# Patient Record
Sex: Female | Born: 1955 | Race: White | Hispanic: No | Marital: Single | Smoking: Current every day smoker
Health system: Southern US, Community
[De-identification: ages and names within clinical notes are randomized; demographics above are authoritative.]

## PROBLEM LIST (undated history)

## (undated) DIAGNOSIS — I10 Essential (primary) hypertension: Secondary | ICD-10-CM

---

## 1999-07-29 ENCOUNTER — Other Ambulatory Visit: Admission: RE | Admit: 1999-07-29 | Discharge: 1999-07-29 | Payer: Self-pay | Admitting: Obstetrics and Gynecology

## 2000-09-02 ENCOUNTER — Other Ambulatory Visit: Admission: RE | Admit: 2000-09-02 | Discharge: 2000-09-02 | Payer: Self-pay | Admitting: Obstetrics and Gynecology

## 2002-05-23 ENCOUNTER — Other Ambulatory Visit: Admission: RE | Admit: 2002-05-23 | Discharge: 2002-05-23 | Payer: Self-pay | Admitting: Obstetrics and Gynecology

## 2003-07-19 ENCOUNTER — Other Ambulatory Visit: Admission: RE | Admit: 2003-07-19 | Discharge: 2003-07-19 | Payer: Self-pay | Admitting: Internal Medicine

## 2005-01-01 ENCOUNTER — Other Ambulatory Visit: Admission: RE | Admit: 2005-01-01 | Discharge: 2005-01-01 | Payer: Self-pay | Admitting: Internal Medicine

## 2005-11-08 ENCOUNTER — Encounter: Admission: RE | Admit: 2005-11-08 | Discharge: 2005-11-08 | Payer: Self-pay | Admitting: Family Medicine

## 2006-02-08 ENCOUNTER — Other Ambulatory Visit: Admission: RE | Admit: 2006-02-08 | Discharge: 2006-02-08 | Payer: Self-pay | Admitting: Obstetrics and Gynecology

## 2006-03-02 ENCOUNTER — Ambulatory Visit (HOSPITAL_COMMUNITY): Admission: RE | Admit: 2006-03-02 | Discharge: 2006-03-02 | Payer: Self-pay | Admitting: Obstetrics and Gynecology

## 2007-04-27 IMAGING — US US TRANSVAGINAL NON-OB
1 series · 14 of 25 positions shown · non-contrast
Comparison: None.

CLINICAL DATA: Fibroid uterus.
 TRANSABDOMINAL AND TRANSVAGINAL PELVIC ULTRASOUND:
TECHNIQUE: Both transabdominal and transvaginal ultrasound examinations of the pelvis were performed including evaluation of the uterus, ovaries, adnexal regions, and pelvic cul-de-sac.

[Series 1: us transvaginal non-ob · 0.33mm/px · 14 of 75 slices shown]
[im 1/75]
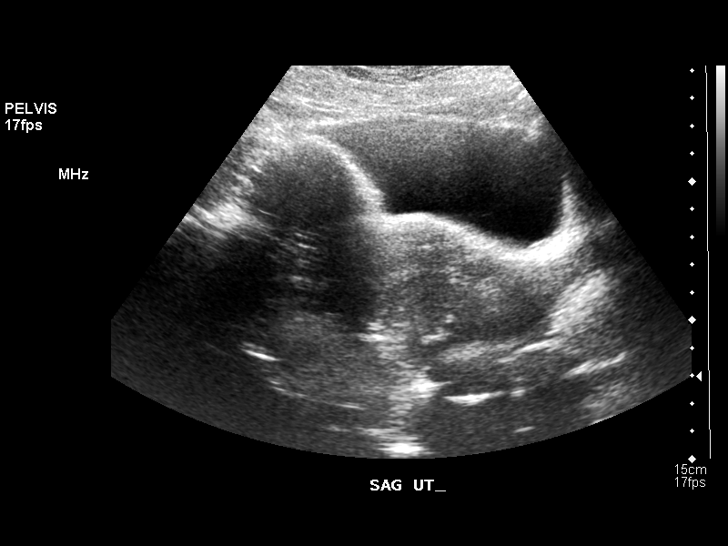
[im 7/75]
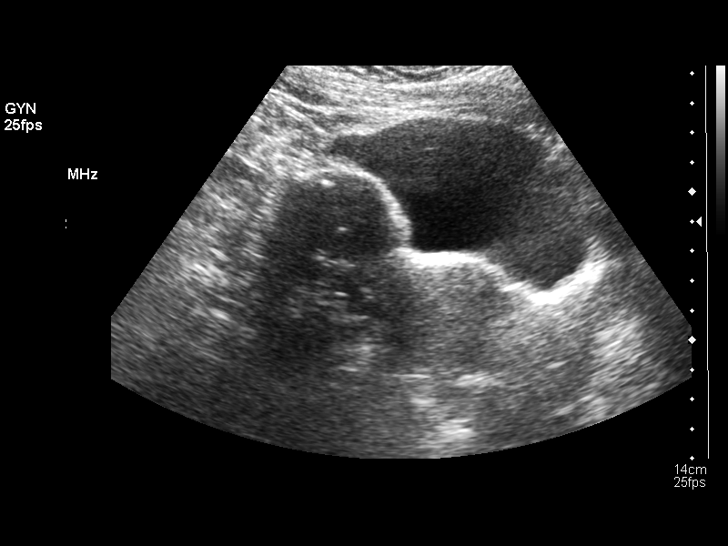
[im 13/75]
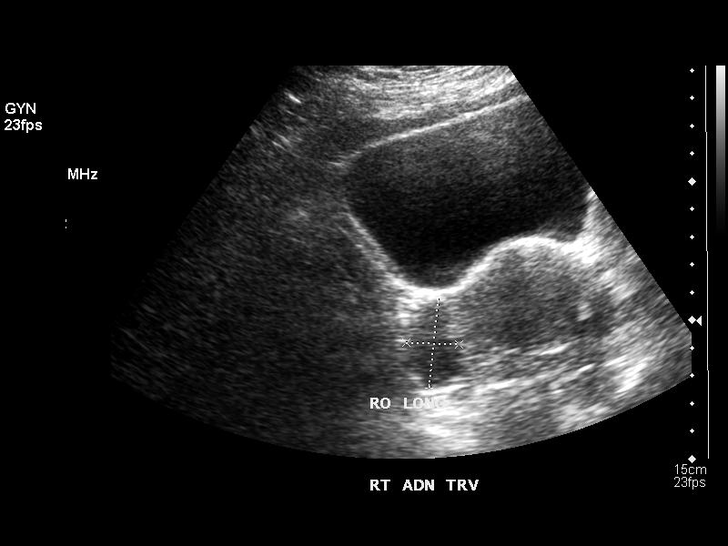
[im 19/75]
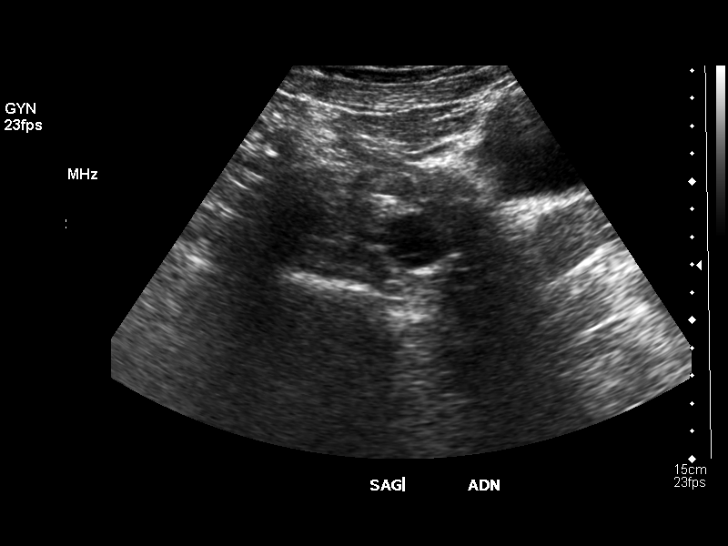
[im 25/75]
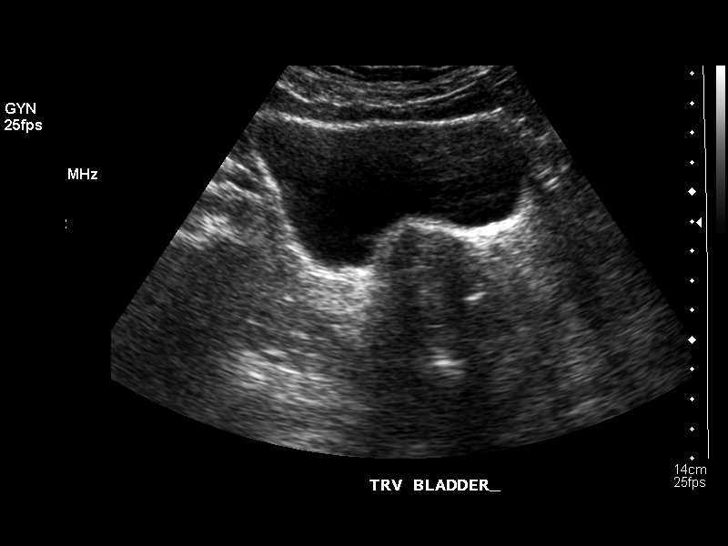
[im 28/75]
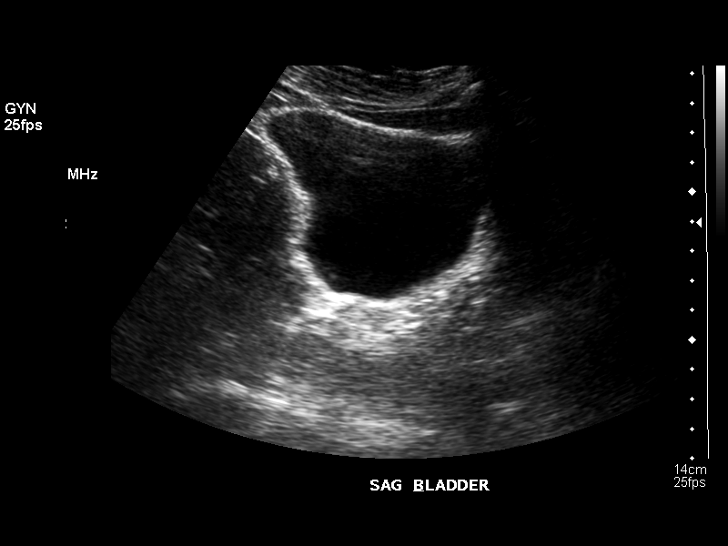
[im 34/75]
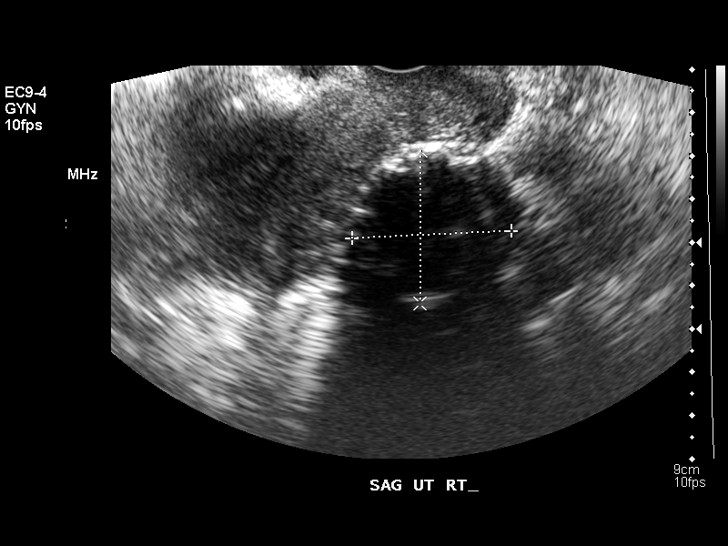
[im 41/75]
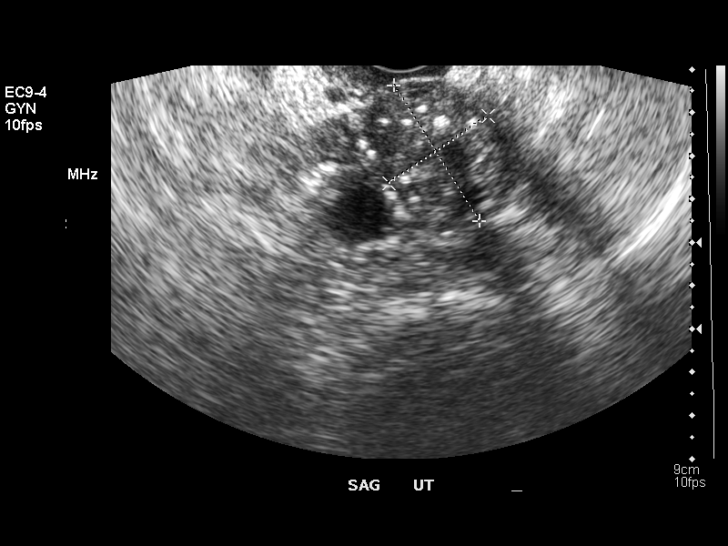
[im 47/75]
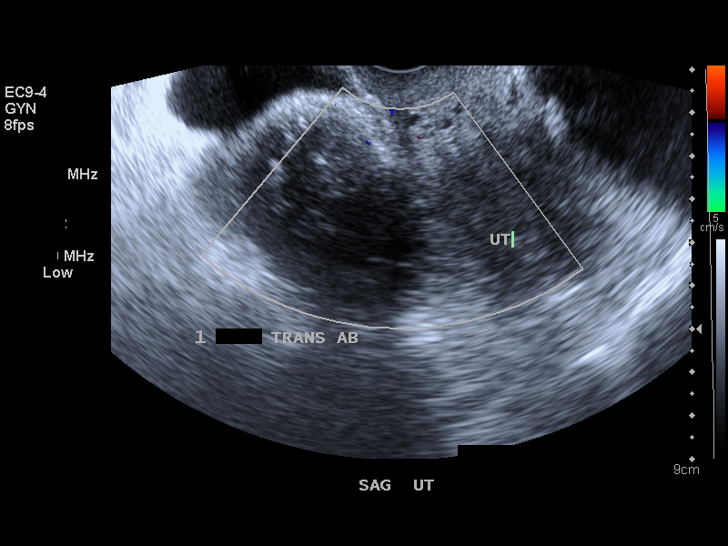
[im 50/75]
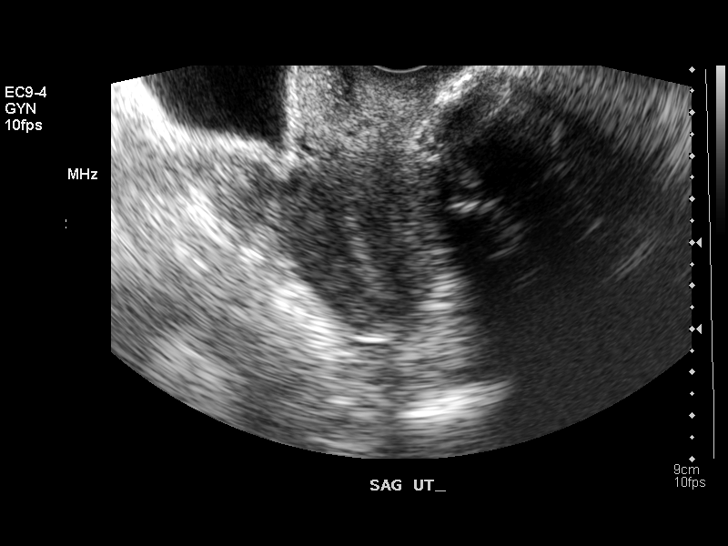
[im 56/75]
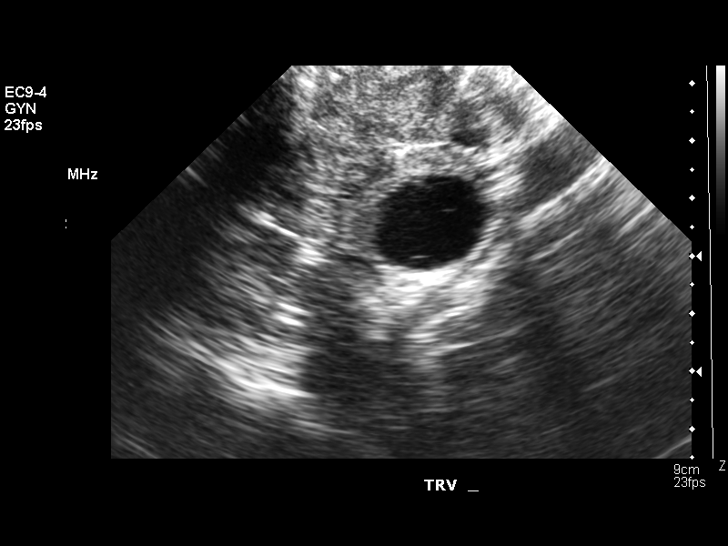
[im 62/75]
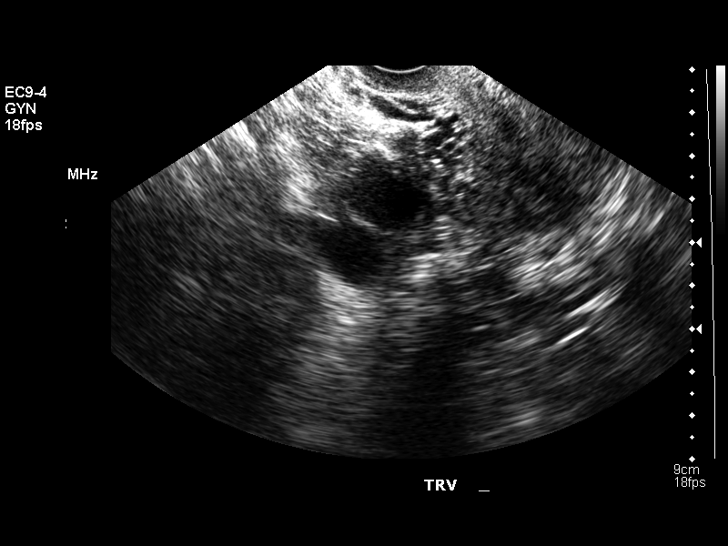
[im 68/75]
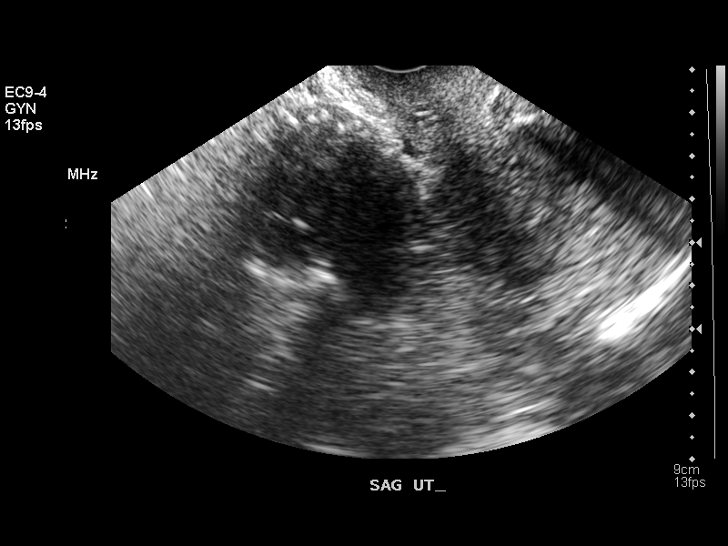
[im 75/75]
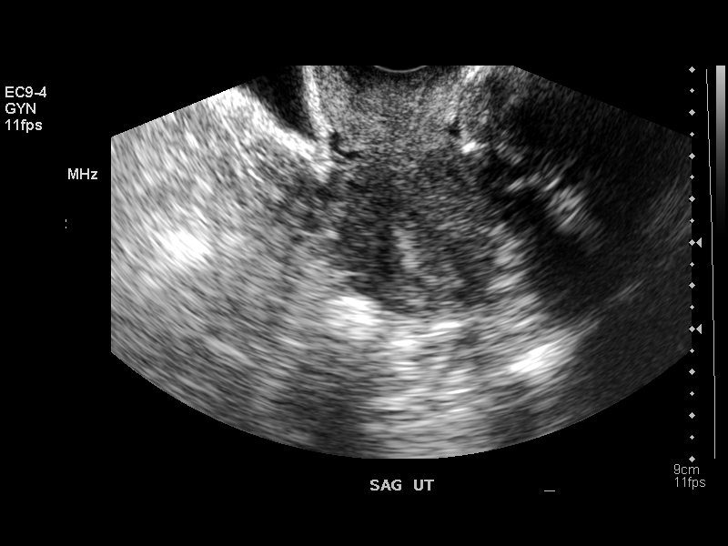

[14 of 25 positions shown; findings below may reference images not displayed]

FINDINGS: The uterus measures 7.3 x 3.8 x 4.4 cm.  The endometrium measures 5 mm and is uniformly echogenic.  At least three partly intramural and partly subserosal uterine fibroids are identified.  The largest of these with an approximate 50% subserosal/50% intramural component measures 4.7 x 4.2 x 4.1 cm at the uterine fundus.  The second largest in the posterior right uterine body measures 3.7 x 3.6 x 3.5 cm and another measures 3.7 x 3.3 x 2.8 cm in the left anterior uterine body.  No appreciable submucosal extent is seen.  The ovaries are unremarkable with bilateral follicles incidentally noted.  No free fluid.
IMPRESSION: Fibroid uterus with fibroids demonstrating 50% subserosal/50% intramural components but no appreciable mass effect upon the endometrial stripe.

## 2010-07-19 ENCOUNTER — Encounter: Payer: Self-pay | Admitting: *Deleted

## 2010-09-09 ENCOUNTER — Other Ambulatory Visit: Payer: Self-pay

## 2010-09-09 ENCOUNTER — Other Ambulatory Visit (HOSPITAL_COMMUNITY)
Admission: RE | Admit: 2010-09-09 | Discharge: 2010-09-09 | Disposition: A | Discharge status: Home / Self Care | Payer: Self-pay | Source: Ambulatory Visit | Attending: Internal Medicine | Admitting: Internal Medicine

## 2010-09-09 ENCOUNTER — Other Ambulatory Visit: Payer: Self-pay | Admitting: Family Medicine

## 2010-09-09 DIAGNOSIS — Z01419 Encounter for gynecological examination (general) (routine) without abnormal findings: Secondary | ICD-10-CM | POA: Insufficient documentation

## 2010-09-09 DIAGNOSIS — Z1231 Encounter for screening mammogram for malignant neoplasm of breast: Secondary | ICD-10-CM

## 2010-10-01 ENCOUNTER — Other Ambulatory Visit: Payer: Self-pay | Admitting: Obstetrics and Gynecology

## 2010-10-21 ENCOUNTER — Ambulatory Visit: Payer: Self-pay

## 2010-11-02 ENCOUNTER — Other Ambulatory Visit (HOSPITAL_COMMUNITY): Payer: Self-pay | Admitting: Family Medicine

## 2010-11-02 DIAGNOSIS — Z1231 Encounter for screening mammogram for malignant neoplasm of breast: Secondary | ICD-10-CM

## 2010-11-06 ENCOUNTER — Ambulatory Visit (HOSPITAL_COMMUNITY): Payer: Self-pay

## 2010-12-04 ENCOUNTER — Ambulatory Visit (HOSPITAL_COMMUNITY)
Admission: RE | Admit: 2010-12-04 | Discharge: 2010-12-04 | Disposition: A | Discharge status: Home / Self Care | Payer: Self-pay | Source: Ambulatory Visit | Attending: Family Medicine | Admitting: Family Medicine

## 2010-12-04 DIAGNOSIS — Z1231 Encounter for screening mammogram for malignant neoplasm of breast: Secondary | ICD-10-CM

## 2012-03-08 ENCOUNTER — Other Ambulatory Visit: Payer: Self-pay | Admitting: Family Medicine

## 2012-03-08 DIAGNOSIS — Z1231 Encounter for screening mammogram for malignant neoplasm of breast: Secondary | ICD-10-CM

## 2017-11-24 ENCOUNTER — Encounter (HOSPITAL_COMMUNITY): Payer: Self-pay | Admitting: Emergency Medicine

## 2017-11-24 ENCOUNTER — Emergency Department (HOSPITAL_COMMUNITY)
Admission: EM | Admit: 2017-11-24 | Discharge: 2017-11-24 | Disposition: A | Discharge status: Home / Self Care | Payer: Self-pay | Attending: Emergency Medicine | Admitting: Emergency Medicine

## 2017-11-24 ENCOUNTER — Other Ambulatory Visit: Payer: Self-pay

## 2017-11-24 DIAGNOSIS — T782XXA Anaphylactic shock, unspecified, initial encounter: Secondary | ICD-10-CM | POA: Insufficient documentation

## 2017-11-24 DIAGNOSIS — I10 Essential (primary) hypertension: Secondary | ICD-10-CM | POA: Insufficient documentation

## 2017-11-24 DIAGNOSIS — N390 Urinary tract infection, site not specified: Secondary | ICD-10-CM | POA: Insufficient documentation

## 2017-11-24 DIAGNOSIS — T7840XA Allergy, unspecified, initial encounter: Secondary | ICD-10-CM

## 2017-11-24 HISTORY — DX: Essential (primary) hypertension: I10

## 2017-11-24 LAB — URINALYSIS, ROUTINE W REFLEX MICROSCOPIC
BILIRUBIN URINE: NEGATIVE
Glucose, UA: 50 mg/dL — AB
Ketones, ur: NEGATIVE mg/dL
NITRITE: NEGATIVE
PH: 6 (ref 5.0–8.0)
Protein, ur: NEGATIVE mg/dL
SPECIFIC GRAVITY, URINE: 1.014 (ref 1.005–1.030)

## 2017-11-24 LAB — WET PREP, GENITAL
Clue Cells Wet Prep HPF POC: NONE SEEN
Sperm: NONE SEEN
Trich, Wet Prep: NONE SEEN
Yeast Wet Prep HPF POC: NONE SEEN

## 2017-11-24 MED ORDER — FAMOTIDINE IN NACL 20-0.9 MG/50ML-% IV SOLN
20.0000 mg | Freq: Once | INTRAVENOUS | Status: AC
  Administered 2017-11-24: 20 mg via INTRAVENOUS
  Filled 2017-11-24: qty 50

## 2017-11-24 MED ORDER — EPINEPHRINE 0.3 MG/0.3ML IJ SOAJ
0.3000 mg | Freq: Once | INTRAMUSCULAR | Status: AC
  Administered 2017-11-24: 0.3 mg via INTRAMUSCULAR
  Filled 2017-11-24: qty 0.3

## 2017-11-24 MED ORDER — METHYLPREDNISOLONE SODIUM SUCC 125 MG IJ SOLR
125.0000 mg | Freq: Once | INTRAMUSCULAR | Status: AC
Start: 2017-11-24 — End: 2017-11-24
  Administered 2017-11-24: 125 mg via INTRAVENOUS
  Filled 2017-11-24: qty 2

## 2017-11-24 MED ORDER — SODIUM CHLORIDE 0.9 % IV SOLN: 1.0000 g | Freq: Once | INTRAVENOUS | Status: DC

## 2017-11-24 MED ORDER — SODIUM CHLORIDE 0.9 % IV BOLUS
1000.0000 mL | Freq: Once | INTRAVENOUS | Status: AC
  Administered 2017-11-24: 1000 mL via INTRAVENOUS

## 2017-11-24 MED ORDER — EPINEPHRINE 0.3 MG/0.3ML IJ SOAJ: 0.3000 mg | Freq: Once | INTRAMUSCULAR | 0 refills | Status: AC

## 2017-11-24 MED ORDER — CEPHALEXIN 500 MG PO CAPS: 500.0000 mg | ORAL_CAPSULE | Freq: Three times a day (TID) | ORAL | 0 refills | Status: AC

## 2017-11-24 NOTE — ED Provider Notes (Signed)
[facility] EMERGENCY DEPARTMENT Provider Note   CSN: [id number] Arrival date & time: 11/24/17  1326     History   Chief Complaint Chief Complaint  Patient presents with  . Allergic Reaction    HPI Alexis Osborn is a 62 y.o. female.  HPI She had eaten some barbecue with hot sauce around 11:30 AM.  She reports that she started to swell up immediately.  Her skin got red all over and extremely itchy.  She started swelling around her face and mouth.  She tried to take a bath to calm things down.  She reports she was itching terribly and scratching all over.  She continued to get worse.  She was wheezing.  Her boyfriend called EMS.  EMS gave Benadryl 50 mg by mouth, EpiPen x2 last dose at 13: 23 and 200 mL normal saline.  Symptoms are improving.  Patient reports she is much less red than she was.  She reports she was wheezing earlier and is not now.  She reports she still has a lot of swelling in her face and lips that is not usual as well as a feeling of hoarseness and tightness in her throat.  Patient reports she has had some prior allergic reactions.  She estimates about 4.  None of them quite this severe.  She reports she never had this kind of facial and oral swelling.  It was never clear what she was really allergic to.  At one time she thought was a reaction to metal in costume jewelry.  Incidentally, and the patient is not sure if this is relevant, she was having some vaginal itching and vulvar swelling about 2 days ago.  Did not have associated full body rash or the symptoms described above. Past Medical History:  Diagnosis Date  . Hypertension     There are no active problems to display for this patient.   History reviewed. No pertinent surgical history.   OB History   None      Home Medications    Prior to Admission medications   Not on File    Family History No family history on file.  Social History Social History   Tobacco Use  .  Smoking status: Current Every Day Smoker  Substance Use Topics  . Alcohol use: Yes  . Drug use: Never     Allergies   Patient has no known allergies.   Review of Systems Review of Systems 10 Systems reviewed and are negative for acute change except as noted in the HPI.   Physical Exam Updated Vital Signs BP 111/72   Pulse 73   Temp 98.6 F (37 C) (Temporal)   Resp 16   Ht  (1.575 m)   Wt 54 kg (119 lb)   SpO2 100%   BMI 21.77 kg/m   Physical Exam  Constitutional: She is oriented to person, place, and time. She appears well-developed and well-nourished.  Patient is alert and appropriate.  She does not have respiratory distress at rest.  She does clearly have facial swelling.  HENT:  Head: Normocephalic and atraumatic.  Swollen approximately 3-4 times normal per the patient's report.  They are diffusely edematous.  Her posterior airway does not appear edematous.  Tongue does not have angioedema.  Voice is slightly hoarse.  No Difficulty managing secretions.  No stridor.  Eyes: Pupils are equal, round, and reactive to light. EOM are normal.  Neck: Neck supple.  Cardiovascular: Normal rate, regular rhythm, normal  heart sounds and intact distal pulses.  Pulmonary/Chest: Effort normal and breath sounds normal.  Abdominal: Soft. Bowel sounds are normal. She exhibits no distension. There is no tenderness.  Genitourinary:  Genitourinary Comments: Normal external vulvar tissues.  Skin exam shows same petechial excoriations right on the introitus.  The cervix is normal.  There is no drainage no discharge and no cervical irritability.  Bimanual is completely nontender.  The excoriated areas just to the inner aspect of the introitus are nontender to palpation.  Do not suspect herpetic ulcerations.  These look the same in morphology as the excoriations she has on the rest of her body.  Musculoskeletal: Normal range of motion. She exhibits no edema.  Neurological: She is alert and  oriented to person, place, and time. She has normal strength. Coordination normal. GCS eye subscore is 4. GCS verbal subscore is 5. GCS motor subscore is 6.  Skin: Skin is warm, dry and intact. Rash noted.  Patient has linear petechial streaks on her back thorax where she has scratched herself.  At this time, the urticarial reaction as described seems to have improved significantly.  She reports is much better in terms of diffuse erythema of the skin.  Psychiatric: She has a normal mood and affect.         ED Treatments / Results  Labs (all labs ordered are listed, but only abnormal results are displayed) Labs Reviewed - No data to display  EKG None  Radiology No results found.  Procedures Procedures (including critical care time) CRITICAL CARE Performed by: Cristy Friedlander   Total critical care time: 30 minutes  Critical care time was exclusive of separately billable procedures and treating other patients.  Critical care was necessary to treat or prevent imminent or life-threatening deterioration.  Critical care was time spent personally by me on the following activities: development of treatment plan with patient and/or surrogate as well as nursing, discussions with consultants, evaluation of patient's response to treatment, examination of patient, obtaining history from patient or surrogate, ordering and performing treatments and interventions, ordering and review of laboratory studies, ordering and review of radiographic studies, pulse oximetry and re-evaluation of patient's condition. Medications Ordered in ED Medications  famotidine (PEPCID) IVPB 20 mg premix (20 mg Intravenous New Bag/Given 11/24/17 1356)  sodium chloride 0.9 % bolus 1,000 mL (1,000 mLs Intravenous New Bag/Given 11/24/17 1404)  sodium chloride 0.9 % bolus 1,000 mL (1,000 mLs Intravenous New Bag/Given 11/24/17 1340)  EPINEPHrine (EPI-PEN) injection 0.3 mg (0.3 mg Intramuscular Given 11/24/17 1350)    methylPREDNISolone sodium succinate (SOLU-MEDROL) 125 mg/2 mL injection 125 mg (125 mg Intravenous Given 11/24/17 1352)     Initial Impression / Assessment and Plan / ED Course  I have reviewed the triage vital signs and the nursing notes.  Pertinent labs & imaging results that were available during my care of the patient were reviewed by me and considered in my medical decision making (see chart for details).      Final Clinical Impressions(s) / ED Diagnoses   Final diagnoses:  Anaphylaxis, initial encounter  Allergic reaction, initial encounter   Present with anaphylactic reaction.  She was given 2 EpiPen's prior to arrival.  She was given another 0.3 subcu epinephrine after arrival.  Volume Medrol 120 mg added.  Also given Pepcid IV.  He continued to show improvement.  She subjectively feels that her swelling has decreased.  After 2-1/2 hours, she does still have mild to moderate swelling of her lips but has no  subjective throat tightness.  At this time, plan will be to watch for 2 more hours to make sure there is no rebound effect.  Plan for discharge with 3 to 4 days of prednisone, continued Benadryl and Pepcid for several days.  Patient will need EpiPen upon discharge. ED Discharge Orders    None       Arby Barrette, MD 11/24/17 1558

## 2017-11-24 NOTE — ED Provider Notes (Signed)
I assumed care at change of shift. Pt is insistent on going home. Advised that I would like to observe her for another hour at least. She understands my concerns about worsening "rebound" symptoms. She has progressively improved though. UA concerning for UTI. Will discharge with keflex and send urine culture. Wet prep/GC still pending at the time of DC.    Raeford Razor, MD 11/24/17 [id number]

## 2017-11-24 NOTE — ED Triage Notes (Signed)
Arrived via EMS from home. At 1130 today patient at [facility] with hot sauce and developed swelling lips, eyes, shortness of breath and hives. Took a bath and boyfriend called EMS. Patient scratching chest and back. EMS administered Benadryl 50 mg PO, Epi 0.3mg  x 2 last dose 1323 and NS 0.9 200 ml. Patient states symptoms improved. States slight shortness of breath. Airway intact bilateral equal chest rise and fall.

## 2017-11-25 LAB — GC/CHLAMYDIA PROBE AMP (~~LOC~~) NOT AT ARMC
Chlamydia: NEGATIVE
Neisseria Gonorrhea: NEGATIVE

## 2017-11-27 LAB — URINE CULTURE: Culture: >=100000 — AB

## 2017-11-28 ENCOUNTER — Telehealth: Payer: Self-pay | Admitting: Emergency Medicine

## 2017-11-28 NOTE — Telephone Encounter (Signed)
Post ED Visit - Positive Culture Follow-up  Culture report reviewed by antimicrobial stewardship pharmacist:  []  Enzo BiNathan Batchelder, Pharm.D. []  Celedonio MiyamotoJeremy Frens, Pharm.D., BCPS AQ-ID []  Garvin FilaMike Maccia, Pharm.D., BCPS []  Georgina PillionElizabeth Moten, Pharm.D., BCPS []  Rolland ColonyMinh Pham, [address] BoulevardPharm.D., BCPS, AAHIVP []  Estella HuskMichelle Turner, Pharm.D., BCPS, AAHIVP []  Lysle Pearlachel Rumbarger, PharmD, BCPS []  Sherlynn CarbonAustin Lucas, PharmD []  Pollyann SamplesAndy Johnston, PharmD, BCPS  Positive urine culture Treated with cephalexin, organism sensitive to the same and no further patient follow-up is required at this time.  Berle MullMiller, Avinash Maltos 11/28/2017, 10:46 AM
